# Patient Record
Sex: Male | Born: 1985 | Race: White | Hispanic: No | Marital: Single | Smoking: Current every day smoker
Health system: Southern US, Community
[De-identification: ages and names within clinical notes are randomized; demographics above are authoritative.]

## PROBLEM LIST (undated history)

## (undated) DIAGNOSIS — F419 Anxiety disorder, unspecified: Secondary | ICD-10-CM

---

## 2019-01-04 ENCOUNTER — Emergency Department (HOSPITAL_BASED_OUTPATIENT_CLINIC_OR_DEPARTMENT_OTHER)
Admission: EM | Admit: 2019-01-04 | Discharge: 2019-01-04 | Disposition: A | Discharge status: Home / Self Care | Payer: PRIVATE HEALTH INSURANCE | Attending: Emergency Medicine | Admitting: Emergency Medicine

## 2019-01-04 ENCOUNTER — Encounter (HOSPITAL_BASED_OUTPATIENT_CLINIC_OR_DEPARTMENT_OTHER): Payer: Self-pay

## 2019-01-04 ENCOUNTER — Other Ambulatory Visit: Payer: Self-pay

## 2019-01-04 ENCOUNTER — Emergency Department (HOSPITAL_BASED_OUTPATIENT_CLINIC_OR_DEPARTMENT_OTHER): Payer: PRIVATE HEALTH INSURANCE

## 2019-01-04 DIAGNOSIS — R0789 Other chest pain: Secondary | ICD-10-CM | POA: Insufficient documentation

## 2019-01-04 DIAGNOSIS — F17200 Nicotine dependence, unspecified, uncomplicated: Secondary | ICD-10-CM | POA: Diagnosis not present

## 2019-01-04 DIAGNOSIS — R079 Chest pain, unspecified: Secondary | ICD-10-CM | POA: Diagnosis present

## 2019-01-04 HISTORY — DX: Anxiety disorder, unspecified: F41.9

## 2019-01-04 LAB — COMPREHENSIVE METABOLIC PANEL
ALT: 36 U/L (ref 0–44)
AST: 31 U/L (ref 15–41)
Albumin: 4.3 g/dL (ref 3.5–5.0)
Alkaline Phosphatase: 84 U/L (ref 38–126)
Anion gap: 11 (ref 5–15)
BUN: 17 mg/dL (ref 6–20)
CO2: 22 mmol/L (ref 22–32)
Calcium: 8.8 mg/dL — ABNORMAL LOW (ref 8.9–10.3)
Chloride: 104 mmol/L (ref 98–111)
Creatinine, Ser: 1.08 mg/dL (ref 0.61–1.24)
GFR calc Af Amer: >60 mL/min (ref >60)
GFR calc non Af Amer: >60 mL/min (ref >60)
Glucose, Bld: 162 mg/dL — ABNORMAL HIGH (ref 70–99)
Potassium: 3.7 mmol/L (ref 3.5–5.1)
Sodium: 137 mmol/L (ref 135–145)
Total Bilirubin: 0.3 mg/dL (ref 0.3–1.2)
Total Protein: 7.1 g/dL (ref 6.5–8.1)

## 2019-01-04 LAB — CBC WITH DIFFERENTIAL/PLATELET
Abs Immature Granulocytes: 0.03 10*3/uL (ref 0.00–0.07)
Basophils Absolute: 0 10*3/uL (ref 0.0–0.1)
Basophils Relative: 0 %
Eosinophils Absolute: 0.1 10*3/uL (ref 0.0–0.5)
Eosinophils Relative: 1 %
HCT: 47.2 % (ref 39.0–52.0)
Hemoglobin: 15.5 g/dL (ref 13.0–17.0)
Immature Granulocytes: 0 %
Lymphocytes Relative: 29 %
Lymphs Abs: 2.9 10*3/uL (ref 0.7–4.0)
MCH: 28.3 pg (ref 26.0–34.0)
MCHC: 32.8 g/dL (ref 30.0–36.0)
MCV: 86.1 fL (ref 80.0–100.0)
Monocytes Absolute: 0.5 10*3/uL (ref 0.1–1.0)
Monocytes Relative: 5 %
Neutro Abs: 6.4 10*3/uL (ref 1.7–7.7)
Neutrophils Relative %: 65 %
Platelets: 221 10*3/uL (ref 150–400)
RBC: 5.48 MIL/uL (ref 4.22–5.81)
RDW: 12.5 % (ref 11.5–15.5)
WBC: 10 10*3/uL (ref 4.0–10.5)
nRBC: 0 % (ref 0.0–0.2)

## 2019-01-04 LAB — TROPONIN I
Troponin I: <0.03 ng/mL (ref <0.03)
Troponin I: 0.03 ng/mL (ref <0.03)
Troponin I: <0.03 ng/mL (ref <0.03)

## 2019-01-04 LAB — LIPASE, BLOOD: Lipase: 30 U/L (ref 11–51)

## 2019-01-04 MED ORDER — FAMOTIDINE 20 MG PO TABS: 20.0000 mg | ORAL_TABLET | Freq: Two times a day (BID) | ORAL | 0 refills | Status: AC

## 2019-01-04 MED ORDER — ALUM & MAG HYDROXIDE-SIMETH 200-200-20 MG/5ML PO SUSP
30.0000 mL | Freq: Once | ORAL | Status: AC
  Administered 2019-01-04: 30 mL via ORAL
  Filled 2019-01-04: qty 30

## 2019-01-04 MED ORDER — SODIUM CHLORIDE 0.9 % IV BOLUS
500.0000 mL | Freq: Once | INTRAVENOUS | Status: AC
  Administered 2019-01-04: 500 mL via INTRAVENOUS

## 2019-01-04 MED ORDER — LIDOCAINE VISCOUS HCL 2 % MT SOLN
15.0000 mL | Freq: Once | OROMUCOSAL | Status: AC
  Administered 2019-01-04: 15 mL via ORAL
  Filled 2019-01-04: qty 15

## 2019-01-04 NOTE — Discharge Instructions (Addendum)
Begin taking Pepcid.  Adjust your diet as best you can to help prevent episodes of reflux, as below.  Please follow-up with your doctor if your symptoms are persisting and for further refills of Pepcid.  Please return emergency department if you develop any new or worsening symptoms.

## 2019-01-04 NOTE — ED Provider Notes (Signed)
I assumed care of patient from Kennedy BuckerAlex Wall, PA-C at shift change, please see her note for full H&P.  Briefly patient is a 33 year old male with a history of anxiety who presents today for evaluation of 30 minutes of left/middle anterior chest pressure.  He reports that it started after he ate [employer].  He feels like it was radiating down his left arm as numbness.  He reports feeling like his heart is beating very quickly.  He has a family history of heart disease with his father having an MI in his early 2640s.   Physical Exam  BP 125/78 (BP Location: Right Arm)   Pulse 65   Temp 98.2 F (36.8 C) (Oral)   Resp 16   Ht 5\' 9"  (1.753 m)   Wt 112.5 kg   SpO2 100%   BMI 36.62 kg/m   Physical Exam Vitals signs and nursing note reviewed.  Constitutional:      General: He is not in acute distress.    Appearance: He is well-developed. He is not diaphoretic.  HENT:     Head: Normocephalic and atraumatic.  Eyes:     General: No scleral icterus.       Right eye: No discharge.        Left eye: No discharge.     Conjunctiva/sclera: Conjunctivae normal.  Neck:     Musculoskeletal: Normal range of motion.  Cardiovascular:     Rate and Rhythm: Normal rate and regular rhythm.     Heart sounds: Normal heart sounds.  Pulmonary:     Effort: Pulmonary effort is normal. No respiratory distress.     Breath sounds: Normal breath sounds. No stridor.  Abdominal:     General: There is no distension.  Musculoskeletal:        General: No deformity.  Skin:    General: Skin is warm and dry.  Neurological:     Mental Status: He is alert.     Motor: No abnormal muscle tone.  Psychiatric:        Behavior: Behavior normal.     ED Course/Procedures   Clinical Course as of Jan 05 31  Fri Jan 04, 2019  1612 On reassessment after GI cocktail, patient denies any chest pain.  Heart rate after fluids has dropped below 100, however does noticeably increased to around 105 when I walked in the room and began  talking to the patient.   [AL]  1928 Spoke with cardiology, plan for 6 hour troponin and call back.    [EH]  2300 I spoke with cardiology Dr. Charna Busmanruby who was made aware of patient by Dr. Purvis SheffieldKoneswaran.  His 6-hour troponin was negative.  Given that the 3-hour troponin was only 0.03 they recommend discharge home with close outpatient follow-up.   [EH]    Clinical Course User Index [AL] Emi HolesLaw, Alexandra M, PA-C [EH] Cristina GongHammond, Amany Rando W, PA-C    Procedures  Dg Chest 2 View  Result Date: 01/04/2019 CLINICAL DATA:  Chest pain, palpitations EXAM: CHEST - 2 VIEW COMPARISON:  None. FINDINGS: The heart size and mediastinal contours are within normal limits. Both lungs are clear. The visualized skeletal structures are unremarkable. IMPRESSION: No acute abnormality of the lungs. Electronically Signed   By: Lauralyn PrimesAlex  Bibbey M.D.   On: 01/04/2019 15:22    Labs Reviewed  COMPREHENSIVE METABOLIC PANEL - Abnormal; Notable for the following components:      Result Value   Glucose, Bld 162 (*)    Calcium 8.8 (*)  All other components within normal limits  TROPONIN I - Abnormal; Notable for the following components:   Troponin I 0.03 (*)    All other components within normal limits  LIPASE, BLOOD  CBC WITH DIFFERENTIAL/PLATELET  TROPONIN I  TROPONIN I    EKG Interpretation  Date/Time:  Friday Jan 04 2019 14:40:24 EDT Ventricular Rate:  111 PR Interval:    QRS Duration: 98 QT Interval:  333 QTC Calculation: 453 R Axis:   23 Text Interpretation:  Sinus tachycardia No previous ECGs available Confirmed by Alvira Monday ([id number]) on 01/04/2019 2:56:13 PM       EKG Interpretation  Date/Time:  Friday Jan 04 2019 14:40:24 EDT Ventricular Rate:  111 PR Interval:    QRS Duration: 98 QT Interval:  333 QTC Calculation: 453 R Axis:   23 Text Interpretation:  Sinus tachycardia No previous ECGs available Confirmed by Alvira Monday ([id number]) on 01/04/2019 2:56:13 PM          Medications  alum & mag  hydroxide-simeth (MAALOX/MYLANTA) 200-200-20 MG/5ML suspension 30 mL (30 mLs Oral Given 01/04/19 1454)    And  lidocaine (XYLOCAINE) 2 % viscous mouth solution 15 mL (15 mLs Oral Given 01/04/19 1455)  sodium chloride 0.9 % bolus 500 mL ( Intravenous Stopped 01/04/19 1605)     MDM   Plan is to follow-up on repeat troponin.  If negative will discharge home.  Troponin returned elevated at 0.03.  I spoke with patient who reports that he is currently not having any chest pressure or tightness.  Given that his troponin has elevated will consult cardiology for further discussion.  Repeat, 6 hours from arrival, troponin was obtained which was normal.  I spoke with on-call cardiologist who recommended discharge home as troponin did not stay elevated and was only 0.03.  Patient is not having any symptoms at this time.    I discussed this with patient and the need for appropriate outpatient follow-up and he states his understanding.  Return precautions were discussed with patient who states their understanding.  At the time of discharge patient denied any unaddressed complaints or concerns.  Patient is agreeable for discharge home.      Cristina Gong, PA-C 01/05/19 Mike Gip    Arby Barrette, MD 01/06/19 Corky Crafts

## 2019-01-04 NOTE — ED Triage Notes (Signed)
C/o CP x 30 min-NAD-steady gait 

## 2019-01-04 NOTE — ED Provider Notes (Addendum)
[facility] EMERGENCY DEPARTMENT Provider Note   CSN: [id number] Arrival date & time: 01/04/19  1429    History   Chief Complaint Chief Complaint  Patient presents with  . Chest Pain    HPI Noah Lamb is a 33 y.o. male with history of anxiety who presents with a 30-minute history of chest pain.  Patient reports his pain was like a dull pressure and radiated to his arm.  It was in his lower chest and epigastric region.  He soon began having palpitations and feeling like his heart was racing and beating out of his chest.  Soon before, patient had eaten Dione Plover and a rib.  He reports his pain is mostly resolved now, however he does have some residual chest tightness.  He states he still feels like his heart is racing.  He reports there could be an anxiety component, as he began getting really nervous when his chest pain started.  Patient denies any diaphoresis, nausea, vomiting, shortness of breath.  Patient was feeling fine before this incident today.  Patient does have family cardiac history, as his dad had heart attacks in his 75s.  Patient denies any recent long trips, surgeries, known cancer, new leg pain or swelling, history of blood clots.  No medications taken prior to arrival.  Patient denies any drug use, including cocaine.  He does drink 6 beers daily.     HPI  Past Medical History:  Diagnosis Date  . Anxiety     There are no active problems to display for this patient.   History reviewed. No pertinent surgical history.      Home Medications    Prior to Admission medications   Medication Sig Start Date End Date Taking? Authorizing Provider  PARoxetine (PAXIL) 20 MG tablet Take by mouth. 06/19/18 06/19/19 Yes [provider]  famotidine (PEPCID) 20 MG tablet Take 1 tablet (20 mg total) by mouth 2 (two) times daily. 01/04/19   Emi Holes, PA-C    Family History History reviewed. No pertinent family history.  Social History Social  History   Tobacco Use  . Smoking status: Current Every Day Smoker  . Smokeless tobacco: Never Used  Substance Use Topics  . Alcohol use: Yes    Comment: weekly  . Drug use: Never     Allergies   Patient has no known allergies.   Review of Systems Review of Systems  Constitutional: Negative for chills, diaphoresis and fever.  HENT: Negative for facial swelling and sore throat.   Respiratory: Positive for chest tightness. Negative for cough and shortness of breath.   Cardiovascular: Positive for chest pain and palpitations. Negative for leg swelling.  Gastrointestinal: Negative for abdominal pain, nausea and vomiting.  Genitourinary: Negative for dysuria.  Musculoskeletal: Negative for back pain.  Skin: Negative for rash and wound.  Neurological: Negative for headaches.  Psychiatric/Behavioral: The patient is not nervous/anxious.      Physical Exam Updated Vital Signs BP 125/78 (BP Location: Right Arm)   Pulse 65   Temp 98.2 F (36.8 C) (Oral)   Resp 16   Ht  (1.753 m)   Wt 112.5 kg   SpO2 100%   BMI 36.62 kg/m   Physical Exam Vitals signs and nursing note reviewed.  Constitutional:      General: He is not in acute distress.    Appearance: He is well-developed. He is not diaphoretic.  HENT:     Head: Normocephalic and atraumatic.  Mouth/Throat:     Pharynx: No oropharyngeal exudate.  Eyes:     General: No scleral icterus.       Right eye: No discharge.        Left eye: No discharge.     Conjunctiva/sclera: Conjunctivae normal.     Pupils: Pupils are equal, round, and reactive to light.  Neck:     Musculoskeletal: Normal range of motion and neck supple.     Thyroid: No thyromegaly.  Cardiovascular:     Rate and Rhythm: Normal rate and regular rhythm.     Heart sounds: Normal heart sounds. No murmur. No friction rub. No gallop.   Pulmonary:     Effort: Pulmonary effort is normal. No respiratory distress.     Breath sounds: Normal breath sounds.  No stridor. No wheezing or rales.  Abdominal:     General: Bowel sounds are normal. There is no distension.     Palpations: Abdomen is soft.     Tenderness: There is no abdominal tenderness. There is no guarding or rebound.  Lymphadenopathy:     Cervical: No cervical adenopathy.  Skin:    General: Skin is warm and dry.     Coloration: Skin is not pale.     Findings: No rash.  Neurological:     Mental Status: He is alert.     Coordination: Coordination normal.      ED Treatments / Results  Labs (all labs ordered are listed, but only abnormal results are displayed) Labs Reviewed  COMPREHENSIVE METABOLIC PANEL - Abnormal; Notable for the following components:      Result Value   Glucose, Bld 162 (*)    Calcium 8.8 (*)    All other components within normal limits  TROPONIN I - Abnormal; Notable for the following components:   Troponin I 0.03 (*)    All other components within normal limits  LIPASE, BLOOD  CBC WITH DIFFERENTIAL/PLATELET  TROPONIN I  TROPONIN I    EKG EKG Interpretation  Date/Time:  Friday Jan 04 2019 14:40:24 EDT Ventricular Rate:  111 PR Interval:    QRS Duration: 98 QT Interval:  333 QTC Calculation: 453 R Axis:   23 Text Interpretation:  Sinus tachycardia No previous ECGs available Confirmed by Alvira MondaySchlossman, Erin ([id number]) on 01/04/2019 2:56:13 PM   Radiology Dg Chest 2 View  Result Date: 01/04/2019 CLINICAL DATA:  Chest pain, palpitations EXAM: CHEST - 2 VIEW COMPARISON:  None. FINDINGS: The heart size and mediastinal contours are within normal limits. Both lungs are clear. The visualized skeletal structures are unremarkable. IMPRESSION: No acute abnormality of the lungs. Electronically Signed   By: Lauralyn PrimesAlex  Bibbey M.D.   On: 01/04/2019 15:22    Procedures Procedures (including critical care time)  Medications Ordered in ED Medications  alum & mag hydroxide-simeth (MAALOX/MYLANTA) 200-200-20 MG/5ML suspension 30 mL (30 mLs Oral Given 01/04/19 1454)     And  lidocaine (XYLOCAINE) 2 % viscous mouth solution 15 mL (15 mLs Oral Given 01/04/19 1455)  sodium chloride 0.9 % bolus 500 mL ( Intravenous Stopped 01/04/19 1605)     Initial Impression / Assessment and Plan / ED Course  I have reviewed the triage vital signs and the nursing notes.  Pertinent labs & imaging results that were available during my care of the patient were reviewed by me and considered in my medical decision making (see chart for details).  Clinical Course as of Jan 04 1633  Fri Jan 04, 2019  1612 On reassessment after  GI cocktail, patient denies any chest pain.  Heart rate after fluids has dropped below 100, however does noticeably increased to around 105 when I walked in the room and began talking to the patient.   [AL]  1928 Spoke with cardiology, plan for 6 hour troponin and call back.    [EH]  2300 I spoke with cardiology Dr. Charna Busman who was made aware of patient by Dr. Purvis Sheffield.  His 6-hour troponin was negative.  Given that the 3-hour troponin was only 0.03 they recommend discharge home with close outpatient follow-up.   [EH]    Clinical Course User Index [AL] Emi Holes, PA-C [EH] Cristina Gong, PA-C       Patient presenting with chest pain and epigastric pain after eating [employer].  He reports a chest pressure and palpitations.  Patient only reporting chest tightness on arrival.  Patient's pain completely resolved after GI cocktail.  Heart rate improved after fluids and patient's time in the ED.  His heart rate has been between 80 and 90.  Initial troponin is negative.  Labs unremarkable except for mild hyperglycemia.  Considering patient just ate, this is expected.  However, advised to make sure to follow-up with PCP.  Chest x-ray is negative.  EKG shows sinus tachycardia.  Very low suspicion of PE or dissection.  Suspect anxiety is because of the tachycardia, as patient states he did get very anxious when his chest pain started and has history of  anxiety.  ACS lower on the differential considering patient's age, however he does have a family history so delta troponin was ordered. HEART score 2  At shift change, delta troponin is pending and oncoming provider Lyndel Safe, PA-C will follow-up.  If negative, plan to discharge home with Pepcid and follow-up to PCP.   Final Clinical Impressions(s) / ED Diagnoses   Final diagnoses:  Atypical chest pain    ED Discharge Orders         Ordered    famotidine (PEPCID) 20 MG tablet  2 times daily     01/04/19 1738           LawWaylan Boga, PA-C 01/04/19 1800    Emi Holes, PA-C 01/05/19 1634    Alvira Monday, MD 01/07/19 1015

## 2020-05-18 IMAGING — CR CHEST - 2 VIEW
2 series · 2 of 2 positions shown · non-contrast
Comparison: None.

CLINICAL DATA: Chest pain, palpitations

EXAM:
CHEST - 2 VIEW

[w chest pa]
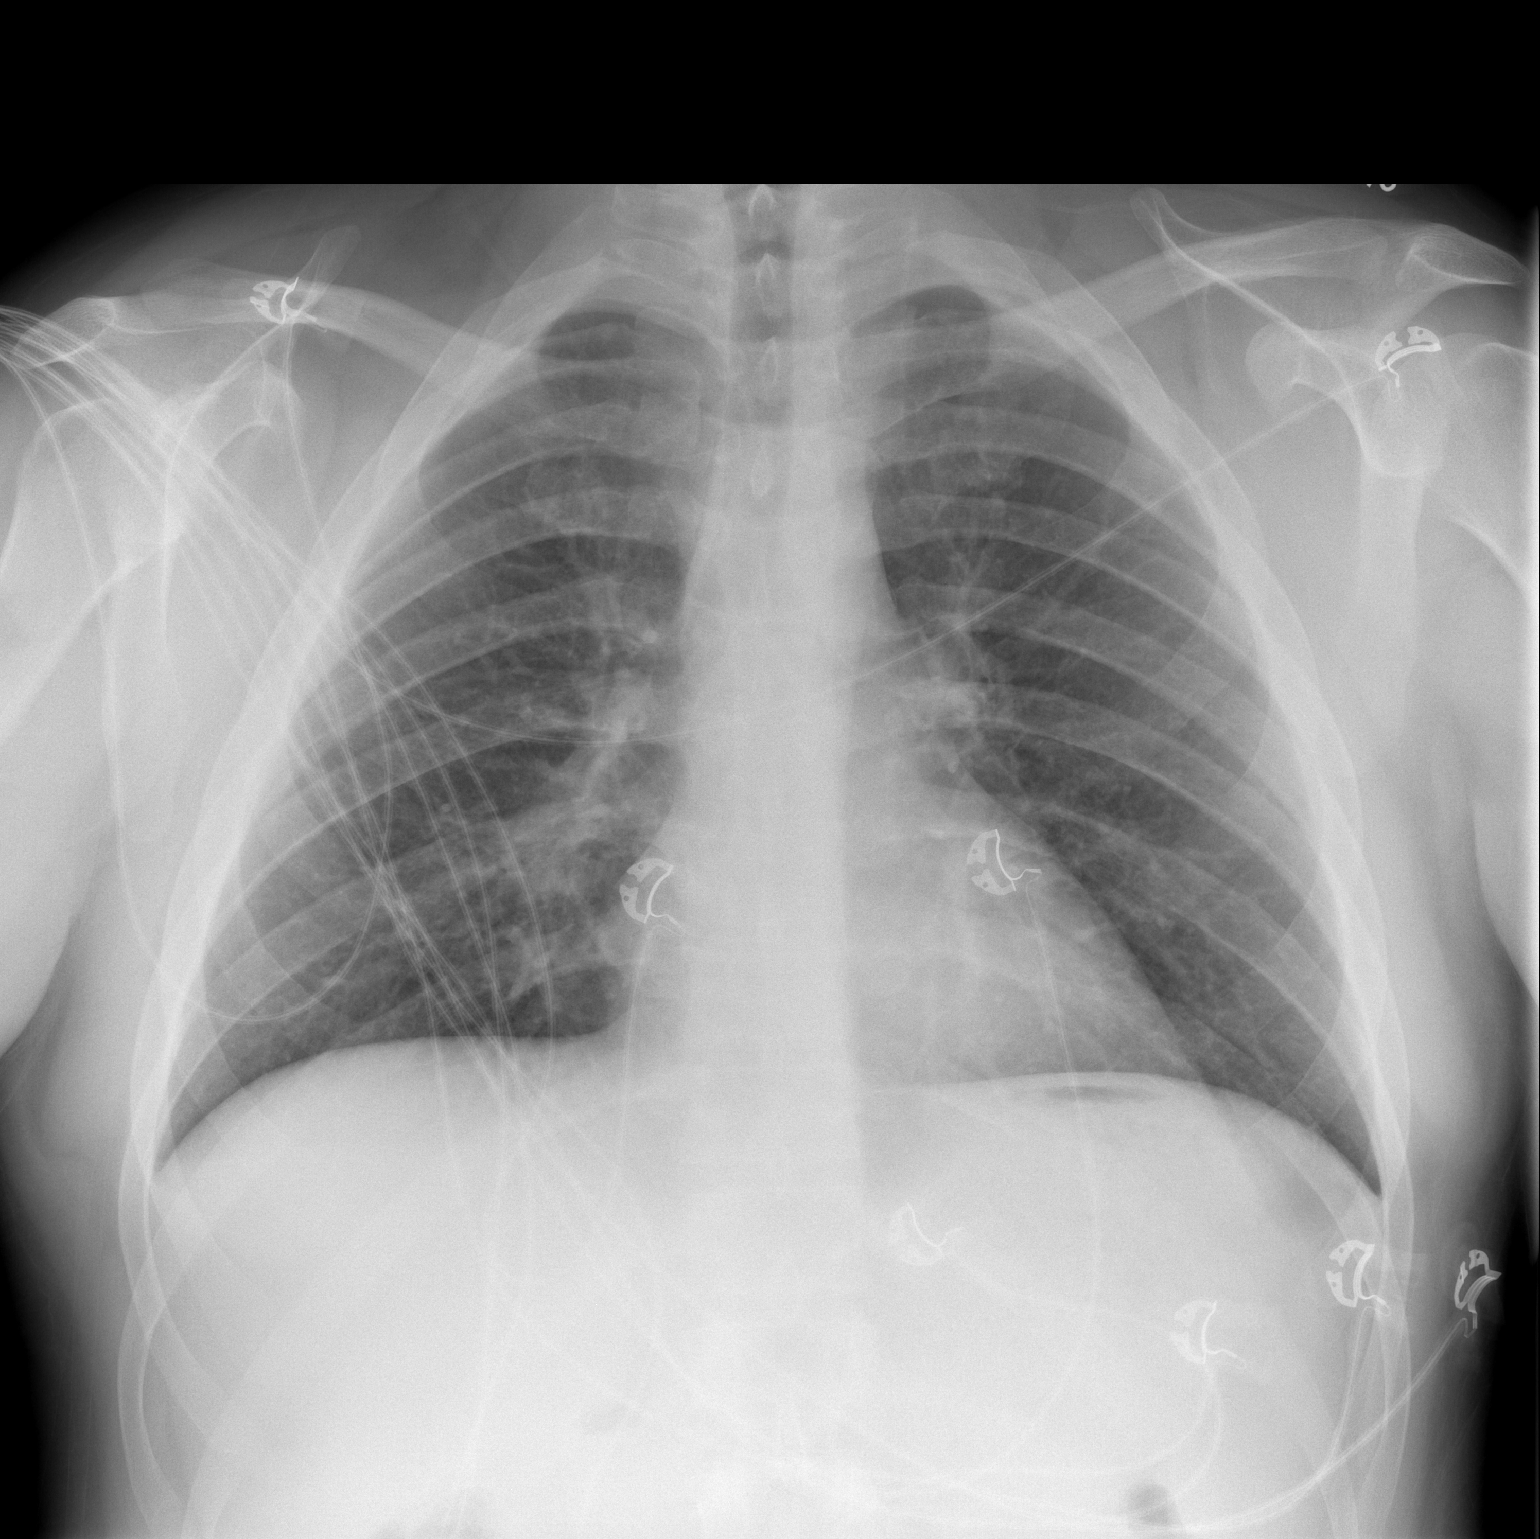

[w chest lat]
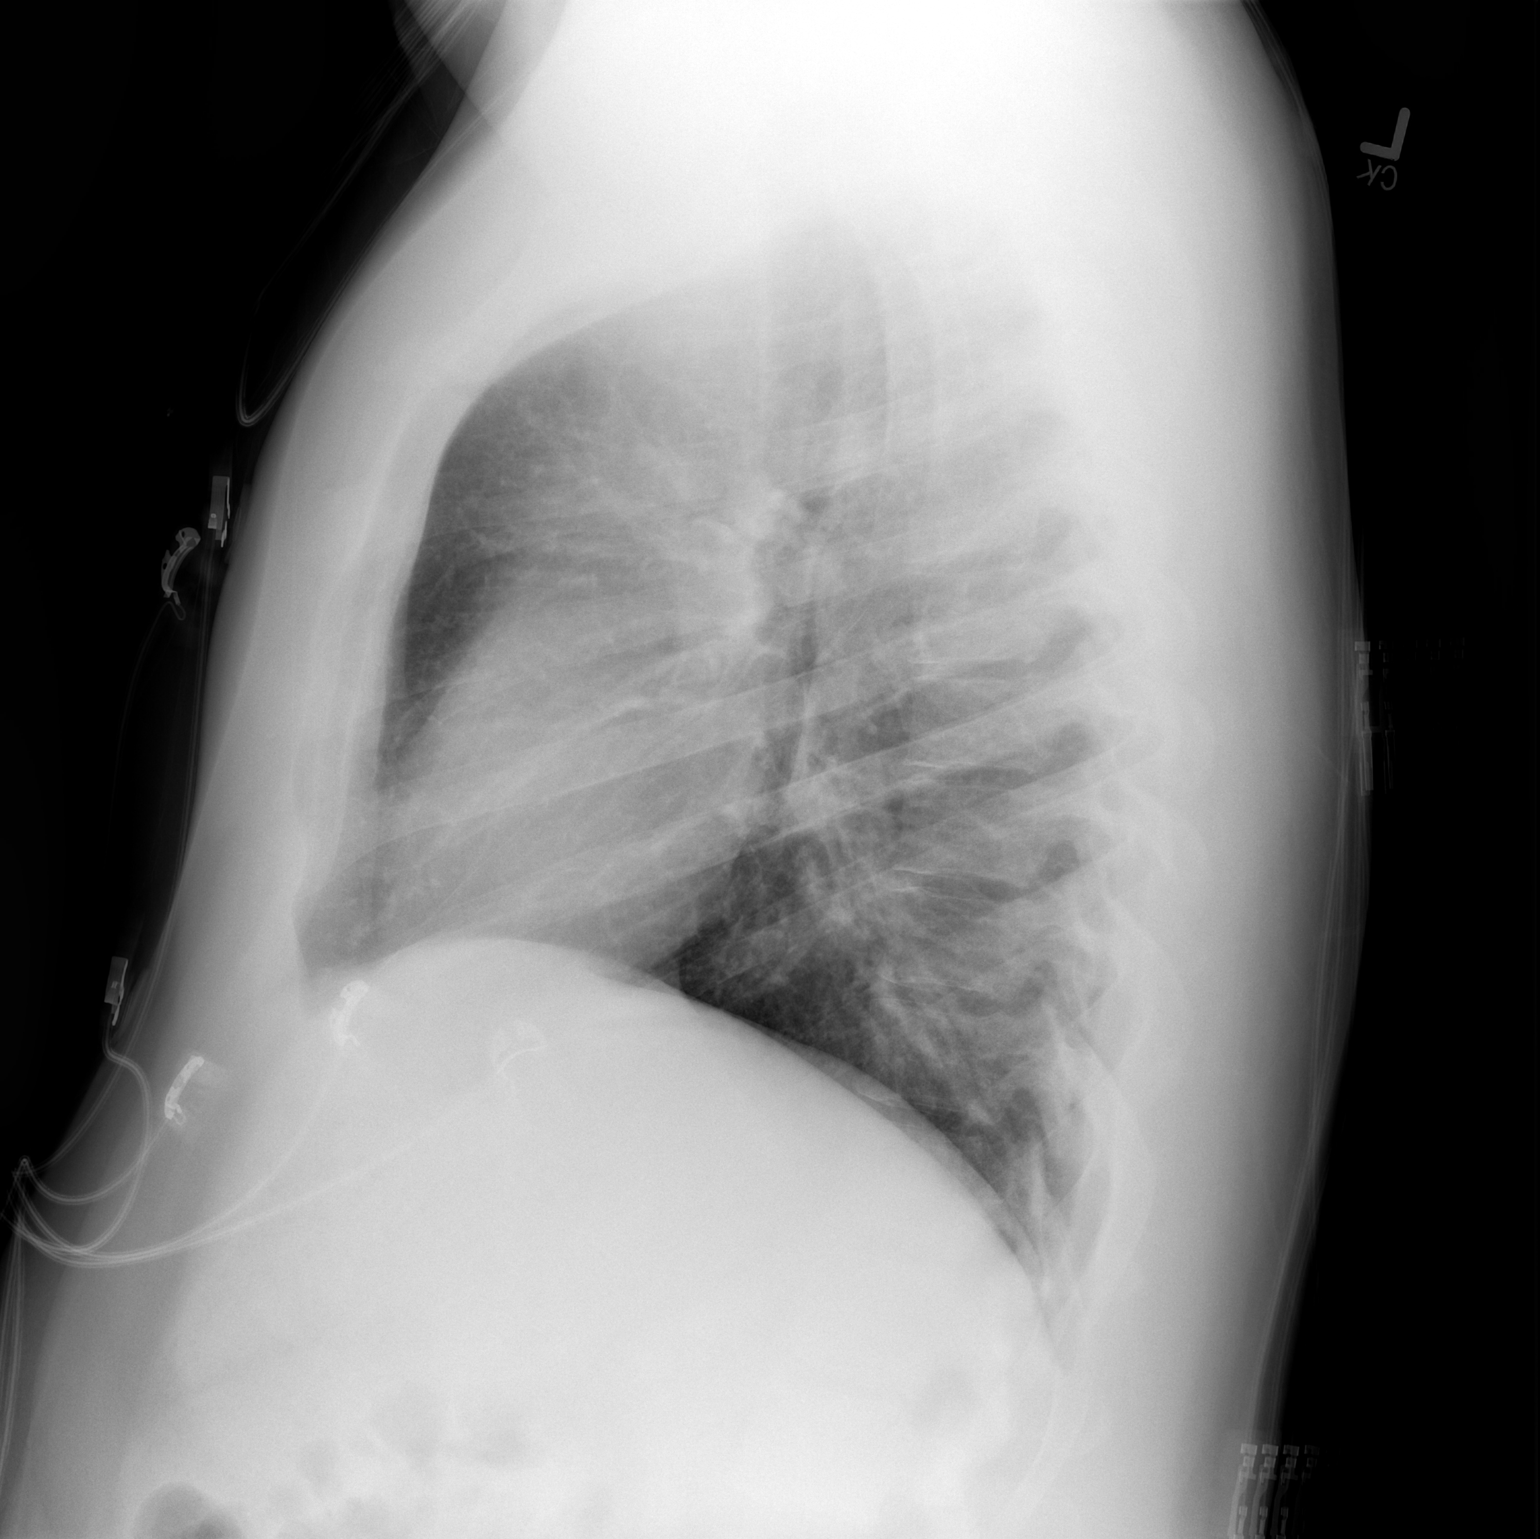

[2 of 2 positions shown; findings below may reference images not displayed]

FINDINGS: The heart size and mediastinal contours are within normal limits.
Both lungs are clear. The visualized skeletal structures are
unremarkable.
IMPRESSION: No acute abnormality of the lungs.

## 2022-02-17 ENCOUNTER — Ambulatory Visit (INDEPENDENT_AMBULATORY_CARE_PROVIDER_SITE_OTHER): Payer: Self-pay | Admitting: Podiatry

## 2022-02-17 DIAGNOSIS — Z91199 Patient's noncompliance with other medical treatment and regimen due to unspecified reason: Secondary | ICD-10-CM

## 2022-04-21 ENCOUNTER — Encounter: Payer: Self-pay | Admitting: Podiatry

## 2022-04-21 ENCOUNTER — Ambulatory Visit (INDEPENDENT_AMBULATORY_CARE_PROVIDER_SITE_OTHER): Payer: Managed Care, Other (non HMO) | Admitting: Podiatry

## 2022-04-21 DIAGNOSIS — L600 Ingrowing nail: Secondary | ICD-10-CM | POA: Diagnosis not present

## 2022-04-21 NOTE — Patient Instructions (Addendum)

## 2022-04-21 NOTE — Progress Notes (Signed)
  Subjective:  Patient ID: Noah Lamb, male    DOB: 1986/08/24,   MRN: [id number]  No chief complaint on file.   36 y.o. male presents for concern of left great ingrown that has been present for about 6 months. Relates he has tried trimming it back but it returns. Relates he would like to get rid of it.  . Denies any other pedal complaints. Denies n/v/f/c.   Past Medical History:  Diagnosis Date   Anxiety     Objective:  Physical Exam: Vascular: DP/PT pulses 2/4 bilateral. CFT <3 seconds. Normal hair growth on digits. No edema.  Skin. No lacerations or abrasions bilateral feet. Incurvation of left hallux lateral border. No erythema edema or purulence noted.  Musculoskeletal: MMT 5/5 bilateral lower extremities in DF, PF, Inversion and Eversion. Deceased ROM in DF of ankle joint.  Neurological: Sensation intact to light touch.   Assessment:  No diagnosis found.   Plan:  Patient was evaluated and treated and all questions answered. Patient requesting removal of ingrown nail today. Procedure below.  Discussed procedure and post procedure care and patient expressed understanding.  Will follow-up in 2 weeks for nail check or sooner if any problems arise.    Procedure:  Procedure: partial Nail Avulsion of left hallux lateral nail border.  Surgeon: Louann Sjogren, DPM  Pre-op Dx: Ingrown toenail without infection Post-op: Same  Place of Surgery: Office exam room.  Indications for surgery: Painful and ingrown toenail.    The patient is requesting removal of nail with chemical matrixectomy. Risks and complications were discussed with the patient for which they understand and written consent was obtained. Under sterile conditions a total of 3 mL of  1% lidocaine plain was infiltrated in a hallux block fashion. Once anesthetized, the skin was prepped in sterile fashion. A tourniquet was then applied. Next the lateral aspect of hallux nail border was then sharply excised making sure  to remove the entire offending nail border.  Next phenol was then applied under standard conditions and copiously irrigated. Silvadene was applied. A dry sterile dressing was applied. After application of the dressing the tourniquet was removed and there is found to be an immediate capillary refill time to the digit. The patient tolerated the procedure well without any complications. Post procedure instructions were discussed the patient for which he verbally understood. Follow-up in two weeks for nail check or sooner if any problems are to arise. Discussed signs/symptoms of infection and directed to call the office immediately should any occur or go directly to the emergency room. In the meantime, encouraged to call the office with any questions, concerns, changes symptoms.   Louann Sjogren, DPM

## 2022-04-29 ENCOUNTER — Encounter: Payer: Self-pay | Admitting: Podiatry

## 2022-05-12 ENCOUNTER — Ambulatory Visit: Payer: PRIVATE HEALTH INSURANCE | Admitting: Podiatry

## 2022-05-13 ENCOUNTER — Ambulatory Visit (INDEPENDENT_AMBULATORY_CARE_PROVIDER_SITE_OTHER): Payer: Managed Care, Other (non HMO) | Admitting: Podiatry

## 2022-05-13 ENCOUNTER — Encounter: Payer: Self-pay | Admitting: Podiatry

## 2022-05-13 DIAGNOSIS — L600 Ingrowing nail: Secondary | ICD-10-CM

## 2022-05-13 NOTE — Progress Notes (Signed)
  Subjective:  Patient ID: Noah Lamb, male    DOB: 1986-03-27,   MRN: [id number]  No chief complaint on file.   36 y.o. male presents for follow-up of left great ingrown nail procedure. Relates it is doing well and has been soaking as instructed.  . Denies any other pedal complaints. Denies n/v/f/c.   Past Medical History:  Diagnosis Date   Anxiety     Objective:  Physical Exam: Vascular: DP/PT pulses 2/4 bilateral. CFT <3 seconds. Normal hair growth on digits. No edema.  Skin. No lacerations or abrasions bilateral feet. Left great toenail healing well. No erythema edema or purulence noted.  Musculoskeletal: MMT 5/5 bilateral lower extremities in DF, PF, Inversion and Eversion. Deceased ROM in DF of ankle joint.  Neurological: Sensation intact to light touch.   Assessment:   1. Ingrown left greater toenail      Plan:  Patient was evaluated and treated and all questions answered. Toe was evaluated and appears to be healing well.  May discontinue soaks and neosporin.  Patient to follow-up as needed.    Louann Sjogren, DPM

## 2022-08-11 ENCOUNTER — Encounter: Payer: Self-pay | Admitting: Podiatrist

## 2022-08-11 ENCOUNTER — Ambulatory Visit (INDEPENDENT_AMBULATORY_CARE_PROVIDER_SITE_OTHER): Payer: Managed Care, Other (non HMO) | Admitting: Podiatrist

## 2022-08-11 DIAGNOSIS — L600 Ingrowing nail: Secondary | ICD-10-CM | POA: Diagnosis not present

## 2022-08-11 MED ORDER — MUPIROCIN 2 % EX OINT: 1.0000 | TOPICAL_OINTMENT | Freq: Two times a day (BID) | CUTANEOUS | 2 refills | Status: AC

## 2022-08-11 NOTE — Patient Instructions (Signed)
Soak Instructions    THE DAY AFTER THE PROCEDURE  Place 1/4 cup of epsom salts in a quart of warm tap water.  Submerge your foot or feet with outer bandage intact for the initial soak; this will allow the bandage to become moist and wet for easy lift off.  Once you remove your bandage, continue to soak in the solution for 20 minutes.  This soak should be done twice a day.  Next, remove your foot or feet from solution, blot dry the affected area and cover.  Apply MUPIROCIN OINTMENT- IT HAS BEEN CALLED INTO YOUR PHARMACY. If you dont' have it yet, you may use polysporin, or simliar.    You may use a band aid large enough to cover the area or use gauze and tape.     IF YOUR SKIN BECOMES IRRITATED WHILE USING THESE INSTRUCTIONS, IT IS OKAY TO SWITCH TO  antibacterial soap pump soap (Dial)  and water to keep the toe clean instead of soaking in epsom salts.    Long Term Care Instructions-Post Nail Surgery  You have had your ingrown toenail and root treated with a chemical.  This chemical causes a burn that will drain and ooze like a blister.  1-2 weeks after the procedure you may leave the area open to air at night to help dry it up.  During the day,  It is important to keep this area clean and covered until the toe dries out and forms a scab. Once the scab forms you no longer need to soak or apply a dressing.  If at any time you experience an increase in pain, redness, swelling, or drainage, you should contact the office as soon as possible.

## 2022-08-11 NOTE — Progress Notes (Signed)
Chief Complaint  Patient presents with   Nail Problem     Left great toe nail possible ingrown     HPI: Patient is 36 y.o. male who presents today for continued pain lateral aspect left hallux nail where an ingrown nail procedure was for performed several months ago.  Relates there is still an area of tenderness in the very corner and he wonders if there could be a piece toenail that may have regrown.  Relates some redness and swelling in the area and pain with pressure.  No Known Allergies  Review of systems is negative except as noted in the HPI.  Denies nausea/ vomiting/ fevers/ chills or night sweats.   Denies difficulty breathing, denies calf pain or tenderness  Physical Exam  Patient is awake, alert, and oriented x 3.  In no acute distress.    Vascular status is intact with palpable pedal pulses 2/4 DP and PT bilateral and capillary refill time less than 3 seconds bilateral.  No edema or erythema noted.   Neurological exam reveals epicritic and protective sensation grossly intact bilateral.   Dermatological exam reveals skin is supple and dry to bilateral feet.  Left hallux proximal lateral nail fold has an area of crust.  Lateral portion of the nail looks like there is probably a small spicule that regrew.  Pain with direct pressure is noted.  Musculoskeletal exam: Musculature intact with dorsiflexion, plantarflexion, inversion, eversion. Ankle and First MPJ joint range of motion normal.     Assessment: Ingrown toenail left first  Plan: Discussed exam findings.  Agree that there likely is a recurring spicule in the corner causing pain.  Recommended trying to remove this area permanently.  The patient agreed I did prepped the skin with alcohol and infiltrated lidocaine Marcaine mixed in a digital block fashion.  The toe was then prepped with Betadine and exsanguinated and a small spicule in the corner of the nail was identified.  It appears to be regrowth in the small corner of  the nail.  Next I applied phenol to the matrix tissue in the corner followed by an alcohol wash and Silvadene cream and a dry sterile compressive dressing was then applied the tourniquet was released noting a prompt hyperemic response to the tip of the toe.  He was given instructions for soaks and aftercare.  He will call if this fails to resolve the issue and otherwise will be seen back in the future as needed.
